# Patient Record
Sex: Male | Born: 2000
Health system: Southern US, Community
[De-identification: ages and names within clinical notes are randomized; demographics above are authoritative.]

## PROBLEM LIST (undated history)

## (undated) HISTORY — PX: TONSILLECTOMY AND ADENOIDECTOMY: SUR1326

---
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
## 2022-03-13 ENCOUNTER — Ambulatory Visit: Payer: 59 | Admitting: Family Medicine

## 2022-03-13 ENCOUNTER — Other Ambulatory Visit (HOSPITAL_BASED_OUTPATIENT_CLINIC_OR_DEPARTMENT_OTHER): Payer: Self-pay

## 2022-03-13 ENCOUNTER — Other Ambulatory Visit (HOSPITAL_COMMUNITY): Payer: Self-pay

## 2022-03-13 ENCOUNTER — Encounter: Payer: Self-pay | Admitting: Family Medicine

## 2022-03-13 VITALS — BP 112/78 | HR 79 | Temp 97.7°F | Resp 16 | Ht 69.5 in | Wt 136.2 lb

## 2022-03-13 DIAGNOSIS — F321 Major depressive disorder, single episode, moderate: Secondary | ICD-10-CM

## 2022-03-13 MED ORDER — FLUOXETINE HCL 10 MG PO TABS
10.0000 mg | ORAL_TABLET | Freq: Every day | ORAL | 3 refills | Status: DC
  Filled 2022-03-13 (×2): qty 30, 30d supply, fill #0

## 2022-03-13 NOTE — Patient Instructions (Signed)
Follow up in 1 month to recheck mood START the Fluoxetine once daily See if you can rediscover the things that bring you joy Call with any questions or concerns CONGRATS ON GRADUATION!!!!

## 2022-03-13 NOTE — Progress Notes (Signed)
   Subjective:    Patient ID: Shane Weaver, male    DOB: 2001/03/20, 21 y.o.   MRN: [id number]  HPI Depression- 'stuff hasn't been enjoyable'.  Mom feels sxs started in 2020.  Decreased motivation, decreased energy.  PHQ=17.  Plan is for [employer] in the fall.  Feeling 'blah'.  Pt 'dreads' work and school.  Has missed a lot of school, has been late multiple times.  Mom reports he is easily overwhelmed.  Pt has done counseling in the past and the counselor thought that he was very self aware and didn't necessarily need to continue.     Review of Systems For ROS see HPI     Objective:   Physical Exam Vitals reviewed.  Constitutional:      General: He is not in acute distress.    Appearance: Normal appearance. He is not ill-appearing.  HENT:     Head: Normocephalic and atraumatic.  Skin:    General: Skin is warm and dry.  Neurological:     General: No focal deficit present.     Mental Status: He is alert and oriented to person, place, and time.     Gait: Gait normal.  Psychiatric:        Behavior: Behavior normal.        Thought Content: Thought content normal.     Comments: Flat affect          Assessment & Plan:

## 2022-03-13 NOTE — Assessment & Plan Note (Signed)
New.  Pt has been struggling w/ mood, motivation, emotional fatigue since ~2020.  PHQ today is 17.  He is graduating next week but feels very ambivalent/apathetic about it.  Plan is for [employer] in the fall and mom hopes to improve his mood before sending him off.  Is 'dreading' work, school.  Has no enjoyment at this time- no longer playing guitar or hanging out with friends.  Denies SI/HI.  Has done counseling in the past.  At this time, is open to medication to improve mood and energy.  Will start w/ Prozac 10mg  daily and titrate prn.  Reviewed how to take medication, possible side effects, mechanism of action, and time to onset of sxs relief.  Discussed importance of starting counseling at school to assist him through his transition.  Will follow closely.

## 2022-04-16 ENCOUNTER — Ambulatory Visit (INDEPENDENT_AMBULATORY_CARE_PROVIDER_SITE_OTHER): Payer: 59 | Admitting: Family Medicine

## 2022-04-16 ENCOUNTER — Encounter: Payer: Self-pay | Admitting: Family Medicine

## 2022-04-16 ENCOUNTER — Other Ambulatory Visit (HOSPITAL_COMMUNITY): Payer: Self-pay

## 2022-04-16 ENCOUNTER — Telehealth: Payer: Self-pay

## 2022-04-16 VITALS — BP 120/78 | HR 90 | Temp 99.0°F | Resp 17 | Ht 69.0 in | Wt 134.2 lb

## 2022-04-16 DIAGNOSIS — F321 Major depressive disorder, single episode, moderate: Secondary | ICD-10-CM | POA: Diagnosis not present

## 2022-04-16 MED ORDER — FLUOXETINE HCL 20 MG PO CAPS
20.0000 mg | ORAL_CAPSULE | Freq: Every day | ORAL | 1 refills | Status: DC
  Filled 2022-04-16 – 2022-04-17 (×2): qty 90, 90d supply, fill #0
  Filled 2022-10-10: qty 90, 90d supply, fill #1

## 2022-04-16 NOTE — Telephone Encounter (Signed)
Notes forgot to ask at appt this morning, Previously discussed medications for acne, wanted to ask about the mediation you had previously recommended for this as he notes it is still an issue for him.

## 2022-04-16 NOTE — Assessment & Plan Note (Signed)
Pt reports sxs are improved since starting Fluoxetine 10mg  daily.  He feels there is still room for additional improvement and is interested in increasing the dose to 20mg  daily.  He is again finding enjoyment in things, has picked up his guitar again, has 2 upcoming gigs and is looking forward to them.  Will increase to 20mg  daily and continue to follow.

## 2022-04-16 NOTE — Progress Notes (Signed)
   Subjective:    Patient ID: Shane Weaver, male    DOB: May 29, 2001, 21 y.o.   MRN: [id number]  HPI Depression- last visit was started on Prozac 10mg .  Pt notes there has been improvement since starting medication.  Feels there is room for additional improvement and thinks we should increase dose.  Will intermittently have trouble sleeping.  Feels motivation has improved.  Is able to enjoy things again.  Back to playing guitar.  Pt feels parents have noticed a change.   Review of Systems For ROS see HPI     Objective:   Physical Exam Vitals reviewed.  Constitutional:      General: He is not in acute distress.    Appearance: Normal appearance. He is not ill-appearing.  HENT:     Head: Normocephalic and atraumatic.  Skin:    General: Skin is warm and dry.  Neurological:     General: No focal deficit present.     Mental Status: He is alert and oriented to person, place, and time.  Psychiatric:        Mood and Affect: Mood normal.        Behavior: Behavior normal.        Thought Content: Thought content normal.           Assessment & Plan:

## 2022-04-16 NOTE — Telephone Encounter (Signed)
Called lm to discuss this with pt no answer LM

## 2022-04-16 NOTE — Patient Instructions (Addendum)
Follow up as scheduled INCREASE the Prozac to 20mg  daily- new prescription sent to pharmacy Let's monitor sleep and see how things go Call with any questions or concerns Keep up the good work!!

## 2022-04-16 NOTE — Telephone Encounter (Signed)
I think it makes more sense to discuss this at his upcoming CPE in 3 weeks.  In the meantime, I would start w/ OTC Neutrogena Acne Wash twice daily

## 2022-04-17 ENCOUNTER — Other Ambulatory Visit (HOSPITAL_COMMUNITY): Payer: Self-pay

## 2022-04-17 ENCOUNTER — Other Ambulatory Visit (HOSPITAL_BASED_OUTPATIENT_CLINIC_OR_DEPARTMENT_OTHER): Payer: Self-pay

## 2022-05-05 ENCOUNTER — Encounter: Payer: Self-pay | Admitting: Family Medicine

## 2022-05-05 ENCOUNTER — Ambulatory Visit (INDEPENDENT_AMBULATORY_CARE_PROVIDER_SITE_OTHER): Payer: 59 | Admitting: Family Medicine

## 2022-05-05 ENCOUNTER — Other Ambulatory Visit (HOSPITAL_BASED_OUTPATIENT_CLINIC_OR_DEPARTMENT_OTHER): Payer: Self-pay

## 2022-05-05 ENCOUNTER — Other Ambulatory Visit (HOSPITAL_COMMUNITY): Payer: Self-pay

## 2022-05-05 VITALS — BP 118/67 | HR 64 | Temp 97.8°F | Resp 19 | Ht 70.0 in | Wt 131.4 lb

## 2022-05-05 DIAGNOSIS — Z23 Encounter for immunization: Secondary | ICD-10-CM | POA: Diagnosis not present

## 2022-05-05 DIAGNOSIS — Z0000 Encounter for general adult medical examination without abnormal findings: Secondary | ICD-10-CM | POA: Insufficient documentation

## 2022-05-05 MED ORDER — CLINDAMYCIN PHOS-BENZOYL PEROX 1-5 % EX GEL
Freq: Two times a day (BID) | CUTANEOUS | 3 refills | Status: DC
  Filled 2022-05-05: qty 25, 10d supply, fill #0
  Filled 2022-05-05: qty 25, 15d supply, fill #0

## 2022-05-05 NOTE — Patient Instructions (Addendum)
Schedule a nurse visit when you are home for the holidays for the 2nd HPV vaccines Follow up with me in 1 year or as needed START the Benzaclin (Clindamycin/Benzoyl Peroxide) in a thin layer twice daily to help fight acne I'm so glad that things are going better!  You deserve it! Call with any questions or concerns Stay Safe!  Stay Healthy! GOOD LUCK AT SCHOOL!!!

## 2022-05-05 NOTE — Assessment & Plan Note (Signed)
Pt's PE WNL.  Due for 2nd Meningitis B vaccine.  Wants to start HPV series.  No need for labs due to lack of risk factors.  Anticipatory guidance provided.

## 2022-05-05 NOTE — Progress Notes (Signed)
   Subjective:    Patient ID: Shane Weaver, male    DOB: 2001-03-14, 21 y.o.   MRN: [id number]  HPI CPE- due for 2nd Meningitis B, HPV.  Not yet sexually active.  Just graduated from HS.  Going to [facility] in August.  Not smoking or vaping.  No drug use.  No alcohol.  Not dating.  No thoughts of self harm.  Wearing seatbelt.     Review of Systems Patient reports no vision/hearing changes, anorexia, fever ,adenopathy, persistant/recurrent hoarseness, swallowing issues, chest pain, palpitations, edema, persistant/recurrent cough, hemoptysis, dyspnea (rest,exertional, paroxysmal nocturnal), gastrointestinal  bleeding (melena, rectal bleeding), abdominal pain, GU symptoms (dysuria, hematuria, voiding/incontinence issues) syncope, focal weakness, memory loss, numbness & tingling, skin/hair/nail changes, depression, anxiety, abnormal bruising/bleeding, musculoskeletal symptoms/signs.   + intermittent heartburn    Objective:   Physical Exam General Appearance:    Alert, cooperative, no distress, appears stated age  Head:    Normocephalic, without obvious abnormality, atraumatic  Eyes:    PERRL, conjunctiva/corneas clear, EOM's intact both eyes       Ears:    Normal TM's and external ear canals, both ears  Nose:   Nares normal, septum midline, mucosa normal, no drainage   or sinus tenderness  Throat:   Lips, mucosa, and tongue normal; teeth and gums normal  Neck:   Supple, symmetrical, trachea midline, no adenopathy;       thyroid:  No enlargement/tenderness/nodules  Back:     Symmetric, no curvature, ROM normal, no CVA tenderness  Lungs:     Clear to auscultation bilaterally, respirations unlabored  Chest wall:    No tenderness  Heart:    Regular rate and rhythm, S1 and S2 normal, no murmur, rub   or gallop  Abdomen:     Soft, non-tender, bowel sounds active all four quadrants,    no masses, no organomegaly  Genitalia:    deferred  Rectal:    Extremities:   Extremities normal, atraumatic, no  cyanosis or edema  Pulses:   2+ and symmetric all extremities  Skin:   Skin color, texture, turgor normal, no rashes or lesions  Lymph nodes:   Cervical, supraclavicular, and axillary nodes normal  Neurologic:   CNII-XII intact. Normal strength, sensation and reflexes      throughout          Assessment & Plan:

## 2022-05-16 ENCOUNTER — Other Ambulatory Visit (HOSPITAL_BASED_OUTPATIENT_CLINIC_OR_DEPARTMENT_OTHER): Payer: Self-pay

## 2022-05-16 DIAGNOSIS — L700 Acne vulgaris: Secondary | ICD-10-CM | POA: Diagnosis not present

## 2022-05-16 MED ORDER — MINOCYCLINE HCL 100 MG PO CAPS
100.0000 mg | ORAL_CAPSULE | Freq: Two times a day (BID) | ORAL | 3 refills | Status: DC
  Filled 2022-05-16: qty 60, 30d supply, fill #0
  Filled 2022-06-30: qty 60, 30d supply, fill #1

## 2022-05-16 MED ORDER — CLINDAMYCIN PHOSPHATE 1 % EX LOTN
TOPICAL_LOTION | CUTANEOUS | 3 refills | Status: DC
  Filled 2022-05-16: qty 60, 14d supply, fill #0

## 2022-06-06 ENCOUNTER — Telehealth: Payer: Self-pay

## 2022-06-06 ENCOUNTER — Other Ambulatory Visit: Payer: Self-pay

## 2022-06-06 NOTE — Telephone Encounter (Signed)
Patient states he would like a call or a message to discuss his Prozac he does not feel ije it is helping at all. Patient has moved to [place] so he states if he needs an appointment he can virtually.

## 2022-06-06 NOTE — Telephone Encounter (Signed)
Please schedule virtual appt so we can discuss and make changes

## 2022-06-10 ENCOUNTER — Telehealth: Payer: Self-pay | Admitting: Family Medicine

## 2022-06-10 NOTE — Telephone Encounter (Signed)
Father calling for pt;

## 2022-06-13 ENCOUNTER — Encounter: Payer: Self-pay | Admitting: Family Medicine

## 2022-06-13 ENCOUNTER — Telehealth (INDEPENDENT_AMBULATORY_CARE_PROVIDER_SITE_OTHER): Payer: 59 | Admitting: Family Medicine

## 2022-06-13 ENCOUNTER — Other Ambulatory Visit (HOSPITAL_COMMUNITY): Payer: Self-pay

## 2022-06-13 DIAGNOSIS — K219 Gastro-esophageal reflux disease without esophagitis: Secondary | ICD-10-CM

## 2022-06-13 DIAGNOSIS — F321 Major depressive disorder, single episode, moderate: Secondary | ICD-10-CM | POA: Diagnosis not present

## 2022-06-13 MED ORDER — FAMOTIDINE 40 MG PO TABS
40.0000 mg | ORAL_TABLET | Freq: Every day | ORAL | 3 refills | Status: DC
  Filled 2022-06-13: qty 30, 30d supply, fill #0

## 2022-06-13 MED ORDER — BUPROPION HCL 75 MG PO TABS
75.0000 mg | ORAL_TABLET | Freq: Two times a day (BID) | ORAL | 3 refills | Status: DC
  Filled 2022-06-13: qty 60, 30d supply, fill #0

## 2022-06-13 NOTE — Progress Notes (Signed)
   Virtual Visit via Video   I connected with patient on 06/13/22 at  9:00 AM EDT by a video enabled telemedicine application and verified that I am speaking with the correct person using two identifiers.  Location patient: Home Location provider: [place], Office Persons participating in the virtual visit: Patient, Provider, CMA Sheryle Hail C)  I discussed the limitations of evaluation and management by telemedicine and the availability of in person appointments. The patient expressed understanding and agreed to proceed.  Subjective:   HPI:   Depression- pt recently moved in to college.  Is doing well w/ the transition.  Doesn't feel like it's much different.  'my mood has been all over the place- even before I moved'.  Pt reports he will have rapid mood changes.  When pt first started the medication he felt that things were improving, 'i don't really know what changed'.  Currently on Prozac 20mg  daily.  GERD- pt reports increased sxs since starting his Minocycline.  Sxs are worse at night.  Having to take antacids pretty regularly.    ROS:   See pertinent positives and negatives per HPI.  Patient Active Problem List   Diagnosis Date Noted   Physical exam 05/05/2022   Depression, major, single episode, moderate (HCC) 03/13/2022   Pectus excavatum 06/29/2019   Colorblind 06/24/2018    Social History   Tobacco Use   Smoking status: Never   Smokeless tobacco: Never  Substance Use Topics   Alcohol use: No    Current Outpatient Medications:    FLUoxetine (PROZAC) 20 MG capsule, Take 1 capsule (20 mg total) by mouth daily., Disp: 90 capsule, Rfl: 1   Melatonin 1 MG CAPS, Take by mouth as needed., Disp: , Rfl:    minocycline (MINOCIN) 100 MG capsule, Take 1 capsule (100 mg total) by mouth 2 (two) times daily with food and water., Disp: 60 capsule, Rfl: 3   clindamycin (CLEOCIN T) 1 % lotion, Apply topically to affected areas up to twice daily as needed. (Patient not  taking: Reported on 06/13/2022), Disp: 60 mL, Rfl: 3   clindamycin-benzoyl peroxide (BENZACLIN) gel, Apply topically 2 (two) times daily. (Patient not taking: Reported on 06/13/2022), Disp: 50 g, Rfl: 3  No Known Allergies  Objective:   There were no vitals taken for this visit. AAOx3, NAD NCAT, EOMI No obvious CN deficits Coloring WNL Pt is able to speak clearly, coherently without shortness of breath or increased work of breathing.  Thought process is linear.  Mood is appropriate.   Assessment and Plan:   Depression- deteriorated.  Pt reports sxs were much improved but seem to have worsened here recently and he's not sure why.  Discussed possibility of increasing Fluoxetine to 40mg  daily vs adding Wellbutrin and he is interested in adding the Wellbutring 75mg  BID.  He reports rapid mood swings which he compares to bipolar, but 'manic sxs' only last 30 minutes to 2 hrs and are more like agitation rather than true mania.  Will continue to follow closely and adjust meds prn.  Pt expressed understanding and is in agreement w/ plan.   GERD- new.  Pt reports sxs started after starting his Minocycline.  Will start prescription strength Famotidine (40mg ) daily to hopefully get ahead of his sxs rather than trying to play catch up w/ antacids.  Pt expressed understanding and is in agreement w/ plan.    08/13/2022, MD 06/13/2022

## 2022-06-13 NOTE — Telephone Encounter (Signed)
Patient was scheduled

## 2022-06-17 NOTE — Telephone Encounter (Signed)
error 

## 2022-06-18 ENCOUNTER — Other Ambulatory Visit (HOSPITAL_COMMUNITY): Payer: Self-pay

## 2022-06-30 ENCOUNTER — Other Ambulatory Visit (HOSPITAL_COMMUNITY): Payer: Self-pay

## 2022-10-10 ENCOUNTER — Other Ambulatory Visit (HOSPITAL_BASED_OUTPATIENT_CLINIC_OR_DEPARTMENT_OTHER): Payer: Self-pay

## 2022-11-26 DIAGNOSIS — L739 Follicular disorder, unspecified: Secondary | ICD-10-CM | POA: Diagnosis not present

## 2022-11-26 DIAGNOSIS — R21 Rash and other nonspecific skin eruption: Secondary | ICD-10-CM | POA: Diagnosis not present

## 2023-11-25 DIAGNOSIS — R051 Acute cough: Secondary | ICD-10-CM | POA: Diagnosis not present

## 2023-11-25 DIAGNOSIS — J069 Acute upper respiratory infection, unspecified: Secondary | ICD-10-CM | POA: Diagnosis not present

## 2024-04-06 ENCOUNTER — Encounter: Payer: Self-pay | Admitting: Family Medicine

## 2024-04-06 ENCOUNTER — Telehealth: Payer: Self-pay

## 2024-04-06 ENCOUNTER — Other Ambulatory Visit (HOSPITAL_COMMUNITY): Payer: Self-pay

## 2024-04-06 ENCOUNTER — Other Ambulatory Visit: Payer: Self-pay

## 2024-04-06 ENCOUNTER — Ambulatory Visit: Payer: Self-pay | Admitting: Family Medicine

## 2024-04-06 VITALS — BP 118/68 | HR 81 | Temp 98.5°F | Ht 71.0 in | Wt 138.4 lb

## 2024-04-06 DIAGNOSIS — F32A Depression, unspecified: Secondary | ICD-10-CM | POA: Diagnosis not present

## 2024-04-06 DIAGNOSIS — F419 Anxiety disorder, unspecified: Secondary | ICD-10-CM | POA: Diagnosis not present

## 2024-04-06 DIAGNOSIS — Z0000 Encounter for general adult medical examination without abnormal findings: Secondary | ICD-10-CM

## 2024-04-06 MED ORDER — SERTRALINE HCL 50 MG PO TABS
50.0000 mg | ORAL_TABLET | Freq: Every day | ORAL | 3 refills | Status: DC
  Filled 2024-04-06: qty 30, 30d supply, fill #0

## 2024-04-06 MED ORDER — SERTRALINE HCL 50 MG PO TABS: 50.0000 mg | ORAL_TABLET | Freq: Every day | ORAL | 3 refills | Status: DC

## 2024-04-06 NOTE — Assessment & Plan Note (Signed)
 Pt's PE WNL.  Declines HPV vaccine.  UTD on Tdap.  No need for labs based on lack of risk factors.  Anticipatory guidance provided.

## 2024-04-06 NOTE — Assessment & Plan Note (Signed)
 Deteriorated.  Pt reports that Prozac  and Wellbutrin  were not helpful so he took himself off the medication.  He feels that sxs have since worsened and he is having racing thoughts, poor sleep, and trouble focusing.  Discussed that this can all be anxiety related or these could be sxs of ADHD.  Will start Sertraline 50mg  daily and monitor closely for improvement.  If no improvement will need to consider ADHD evaluation.  Pt expressed understanding and is in agreement w/ plan.

## 2024-04-06 NOTE — Progress Notes (Signed)
   Subjective:    Patient ID: Shane Weaver, male    DOB: 11-13-2000, 23 y.o.   MRN: [id number]  HPI CPE- UTD on Tdap.  Due for HPV- declines.  Taking Japanese intensive summer course.  Heads back to [facility] in late August.    Not smoking or vaping.  Occasional alcohol.  No drug use.  Dating, feels safe in relationship.  Using condoms.    Patient Care Team    Relationship Specialty Notifications Start End  Mahlon Comer BRAVO, MD PCP - General Family Medicine  07/29/16      Health Maintenance  Topic Date Due   COVID-19 Vaccine (1 - 2024-25 season) 04/22/2024 (Originally 06/14/2023)   HPV VACCINES (2 - Male 3-dose series) 04/06/2025 (Originally 06/02/2022)   INFLUENZA VACCINE  05/13/2024   DTaP/Tdap/Td (7 - Td or Tdap) 07/19/2024   Pneumococcal Vaccine 66-56 Years old  Completed   Hepatitis B Vaccines  Completed   Meningococcal B Vaccine  Completed   Hepatitis C Screening  Discontinued   HIV Screening  Discontinued      Review of Systems Patient reports no vision/hearing changes, anorexia, fever ,adenopathy, persistant/recurrent hoarseness, swallowing issues, chest pain, palpitations, edema, persistant/recurrent cough, hemoptysis, dyspnea (rest,exertional, paroxysmal nocturnal), gastrointestinal  bleeding (melena, rectal bleeding), abdominal pain, excessive heart burn, GU symptoms (dysuria, hematuria, voiding/incontinence issues) syncope, focal weakness, memory loss, numbness & tingling, skin/hair/nail changes,  abnormal bruising/bleeding, musculoskeletal symptoms/signs.   + anxiety/depression- pt reports anxiety is worsening.  Did not feel that Fluoxetine  and Wellbutrin  were helping so he stopped these meds.  Notes racing thoughts.  + insomnia    Objective:   Physical Exam General Appearance:    Alert, cooperative, no distress, appears stated age  Head:    Normocephalic, without obvious abnormality, atraumatic  Eyes:    PERRL, conjunctiva/corneas clear, EOM's intact both eyes        Ears:    Normal TM's and external ear canals, both ears  Nose:   Nares normal, septum midline, mucosa normal, no drainage   or sinus tenderness  Throat:   Lips, mucosa, and tongue normal; teeth and gums normal  Neck:   Supple, symmetrical, trachea midline, no adenopathy;       thyroid:  No enlargement/tenderness/nodules  Back:     Symmetric, no curvature, ROM normal, no CVA tenderness  Lungs:     Clear to auscultation bilaterally, respirations unlabored  Chest wall:    No tenderness or deformity  Heart:    Regular rate and rhythm, S1 and S2 normal, no murmur, rub   or gallop  Abdomen:     Soft, non-tender, bowel sounds active all four quadrants,    no masses, no organomegaly  Genitalia:    deferred  Rectal:    Extremities:   Extremities normal, atraumatic, no cyanosis or edema  Pulses:   2+ and symmetric all extremities  Skin:   Skin color, texture, turgor normal, no rashes or lesions  Lymph nodes:   Cervical, supraclavicular, and axillary nodes normal  Neurologic:   CNII-XII intact. Normal strength, sensation and reflexes      throughout          Assessment & Plan:

## 2024-04-06 NOTE — Patient Instructions (Signed)
 Follow up in 4-6 weeks to recheck mood START the Sertraline once daily Keep up the good work- you look great! Call with any questions or concerns Stay Safe!  Stay Healthy! Have a great summer!!!

## 2024-04-06 NOTE — Telephone Encounter (Signed)
 Spoke with patient's father and let him know that I had corrected the pharmacy and sent the medication there.

## 2024-04-07 ENCOUNTER — Other Ambulatory Visit (HOSPITAL_COMMUNITY): Payer: Self-pay

## 2024-04-08 ENCOUNTER — Other Ambulatory Visit (HOSPITAL_COMMUNITY): Payer: Self-pay

## 2024-04-29 ENCOUNTER — Encounter: Payer: Self-pay | Admitting: Student in an Organized Health Care Education/Training Program

## 2024-04-29 ENCOUNTER — Ambulatory Visit (INDEPENDENT_AMBULATORY_CARE_PROVIDER_SITE_OTHER): Admitting: Student in an Organized Health Care Education/Training Program

## 2024-04-29 ENCOUNTER — Ambulatory Visit: Admitting: Family Medicine

## 2024-04-29 VITALS — BP 124/64 | HR 80 | Ht 71.0 in | Wt 140.2 lb

## 2024-04-29 DIAGNOSIS — F32A Depression, unspecified: Secondary | ICD-10-CM

## 2024-04-29 DIAGNOSIS — R41840 Attention and concentration deficit: Secondary | ICD-10-CM | POA: Insufficient documentation

## 2024-04-29 DIAGNOSIS — F419 Anxiety disorder, unspecified: Secondary | ICD-10-CM | POA: Diagnosis not present

## 2024-04-29 NOTE — Patient Instructions (Signed)
 If our referral person cannot get you scheduled testing for ADHD soon, please reach out to the below office:  [facility] Address: [address] [place], [place], [place] [address] Phone: [phone]   VISIT SUMMARY: During your visit, we discussed your worsening depression and anxiety symptoms after starting Zoloft , as well as your struggles with motivation, sleep, and potential ADHD. We reviewed your medication history and current symptoms to develop a plan moving forward.  YOUR PLAN: -DEPRESSIVE DISORDER: Depressive disorder is a condition characterized by persistent feelings of sadness and loss of interest. You experienced worsening symptoms after starting Zoloft , including a depressed mood and thoughts of self-harm. We decided to discontinue Zoloft  and hold off on starting any new medications for anxiety or depression until further evaluation.  -ATTENTION-DEFICIT/HYPERACTIVITY DISORDER (ADHD) - SUSPECTED: ADHD is a condition that includes symptoms such as difficulty paying attention, hyperactivity, and impulsiveness. You have reported symptoms that suggest ADHD, such as difficulty starting and completing tasks. We will refer you for an ADHD evaluation and testing to confirm the diagnosis.  -INSOMNIA: Insomnia is a sleep disorder where you have trouble falling and/or staying asleep. You reported difficulty initiating sleep and frequent awakenings. We recommend continuing to use melatonin as needed to help with sleep initiation.  INSTRUCTIONS: Please discontinue taking Zoloft  immediately. We will not start any new medications for anxiety or depression until we have further evaluated your symptoms. You will be referred for an ADHD evaluation and testing. Continue using melatonin as needed to help with sleep initiation. Follow up with us  after your ADHD evaluation or sooner if your symptoms worsen.

## 2024-04-29 NOTE — Assessment & Plan Note (Signed)
 Experiencing worsening depressive symptoms after starting sertraline  (Zoloft ) approximately four weeks ago, including depressed mood, lack of motivation, difficulty with coursework, and new-onset thoughts of self-harm. Reported improvement after missing a dose, suggesting an adverse reaction. Previous trials with Prozac  and Wellbutrin  were unsatisfactory, indicating potential sensitivity to these medications. Further antidepressant trials are not recommended due to adverse effects and lack of efficacy with multiple SSRIs and Wellbutrin . - Discontinue sertraline  (Zoloft ) - Hold off on starting any new anxiety or depression medications until further evaluation

## 2024-04-29 NOTE — Progress Notes (Signed)
 Established Patient Office Visit  Subjective   Patient ID: Shane Weaver, male    DOB: Sep 17, 2001  Age: 23 y.o. MRN: [id number]  Chief Complaint  Patient presents with   Anxiety    Following up on zoloft . Thinks medication has made things worse. Feeling weird and gotten depressed since taking it. Missed 1 day and woke up feeling better. Stopped zoloft  2 days ago    HPI  Discussed the use of AI scribe software for clinical note transcription with the patient, who gave verbal consent to proceed.  History of Present Illness Shane Weaver is a 23 year old male who presents with worsening depression and anxiety after starting Zoloft .  He started Zoloft  four weeks ago and initially experienced dissociative feelings during the first week, which subsided. However, his mental health declined with a rapid increase in depressive symptoms, primarily a depressed mood impacting his ability to complete coursework for an online Japanese intensive course.  Two days ago, he forgot to take Zoloft  and noticed an improvement in his mood the following day, as noted by his girlfriend. He has previously tried Prozac  and Wellbutrin  without satisfactory results. He currently experiences a lack of motivation, difficulty getting out of bed, and increased frustration. He also has insomnia, with difficulty initiating sleep and frequent awakenings, despite occasional use of melatonin.  He has been informed by others, including his girlfriend, that he might have ADHD, and he reports experiencing symptoms such as difficulty starting and finishing tasks. He is currently off for the summer but is enrolled in a college program starting in early August.  Thoughts of self-harm emerged after starting Zoloft , although he has no intention of acting on them. He is not taking any other medications except for biotin for hair and melatonin occasionally.     Objective:     BP 124/64 (BP Location: Right Arm, Cuff Size: Normal)    Pulse 80   Ht 5' 11 (1.803 m)   Wt 140 lb 3.2 oz (63.6 kg)   SpO2 100%   BMI 19.55 kg/m    Physical Exam  Gen: Well-appearing Neck: Normal thyroid no nodules or adenopathy Heart: Regular, no murmur Lungs: Unlabored, clear throughout Psych: Appropriate mood and affect, not anxious or depressed appearing on exam, he is distractible at times but otherwise well focused, normal speech, organized thoughts    Assessment & Plan:    Problem List Items Addressed This Visit       Unprioritized   Anxiety and depression   Experiencing worsening depressive symptoms after starting sertraline  (Zoloft ) approximately four weeks ago, including depressed mood, lack of motivation, difficulty with coursework, and new-onset thoughts of self-harm. Reported improvement after missing a dose, suggesting an adverse reaction. Previous trials with Prozac  and Wellbutrin  were unsatisfactory, indicating potential sensitivity to these medications. Further antidepressant trials are not recommended due to adverse effects and lack of efficacy with multiple SSRIs and Wellbutrin . - Discontinue sertraline  (Zoloft ) - Hold off on starting any new anxiety or depression medications until further evaluation      Relevant Orders   Ambulatory referral to Psychology   Attention and concentration deficit - Primary   Exhibits symptoms suggestive of ADHD, including difficulty starting and completing tasks, lack of motivation, and academic struggles. Symptoms align with ADHD, and he and others have noted these tendencies. Open to further evaluation to confirm the diagnosis. Addressing ADHD may provide a more targeted approach to managing his symptoms. - Refer for ADHD evaluation and testing  Relevant Orders   Ambulatory referral to Psychology    Return in about 5 weeks (around 06/03/2024) for Follow up with Dr. Mahlon.    Cleatus Debby Specking, MD

## 2024-04-29 NOTE — Assessment & Plan Note (Signed)
 Exhibits symptoms suggestive of ADHD, including difficulty starting and completing tasks, lack of motivation, and academic struggles. Symptoms align with ADHD, and he and others have noted these tendencies. Open to further evaluation to confirm the diagnosis. Addressing ADHD may provide a more targeted approach to managing his symptoms. - Refer for ADHD evaluation and testing

## 2024-05-04 ENCOUNTER — Telehealth: Payer: Self-pay

## 2024-05-04 NOTE — Telephone Encounter (Signed)
 Patient saw Dr. Jerrell for ADHD follow up on 04/29/24. He d/c Zoloft  and was told to hold off on medication until further evaluation. A referral was placed that day for [facility]. He has a follow up appointment with Dr. Mahlon in August. Please advise, thank you   Copied from CRM [id number]. Topic: Clinical - Medication Question >> May 03, 2024  4:40 PM Rea C wrote: Reason for CRM: Reason for CRM: Patient was speaking with Dr. Mahlon about new medications that he may want to try because he didn't have success with Zoloft . And, they also inquired about ADHD testing. One medication patient learned about was Guanfacine and it's had success for both people that referred him. Patient is wondering if that may be an option for him to take.   [phone] (H)

## 2024-05-04 NOTE — Telephone Encounter (Signed)
 Called patient to relay information below. Unable to reach. Left vm to return call. Has referral team contacted patient?

## 2024-05-04 NOTE — Telephone Encounter (Signed)
 Given his history of side effects to medications, would not recommend guanfacine right now.  Would recommend formal testing for attention differences prior to initiating ADHD medications.

## 2024-05-04 NOTE — Telephone Encounter (Signed)
 Looking at referral it looks like a vm was left for patient and they have added him to the testing wait list. They will call when appointment becomes available. See image below

## 2024-05-05 NOTE — Telephone Encounter (Unsigned)
 Copied from CRM #[id number]. Topic: General - Call Back - No Documentation >> May 04, 2024  3:06 PM Shereese L wrote: Reason for CRM: relayed message as per referral stated  and they have added him to the testing wait list. >> May 05, 2024  4:41 PM [place] J wrote: Patient would like to let Dr. Mahlon know that his school offers ADHD testing at a more affordable price and he will most likely be going with this option. Being said, he would like to be removed for the wait list we added him to for ADHD testing.

## 2024-05-05 NOTE — Telephone Encounter (Signed)
 Called patient to relay both messages for referral and medication, Left VM to return call.

## 2024-05-06 NOTE — Telephone Encounter (Signed)
 Called patient to to let him know he will have to call [facility] to cancel his appointment on the waiting list. Did relay the number to the patient. Patient states his school offers testing for a cheaper price and would prefer to go this route.

## 2024-05-30 ENCOUNTER — Telehealth (INDEPENDENT_AMBULATORY_CARE_PROVIDER_SITE_OTHER): Admitting: Family Medicine

## 2024-05-30 ENCOUNTER — Encounter: Payer: Self-pay | Admitting: Family Medicine

## 2024-05-30 ENCOUNTER — Ambulatory Visit: Admitting: Family Medicine

## 2024-05-30 DIAGNOSIS — F419 Anxiety disorder, unspecified: Secondary | ICD-10-CM

## 2024-05-30 DIAGNOSIS — F32A Depression, unspecified: Secondary | ICD-10-CM

## 2024-05-30 MED ORDER — BUSPIRONE HCL 7.5 MG PO TABS: 7.5000 mg | ORAL_TABLET | Freq: Two times a day (BID) | ORAL | 3 refills | Status: AC

## 2024-05-30 NOTE — Progress Notes (Unsigned)
   Virtual Visit via Video   I connected with patient on 05/30/24 at  2:20 PM EDT by a video enabled telemedicine application and verified that I am speaking with the correct person using two identifiers.  Location patient: Home Location provider: [facility] Office Persons participating in the virtual visit: Patient, Provider, CMA (Tonya H)  I discussed the limitations of evaluation and management by telemedicine and the availability of in person appointments. The patient expressed understanding and agreed to proceed.  Subjective:   HPI:   Anxiety- pt reports ongoing high anxiety.  Yesterday was anxious for much of the day- despite it being his [age] and classes not starting yet.  Reports he can't pinpoint a specific trigger.  Feels anxiety will just sort of 'hit out of nowhere'.  Pt feels that he's anxious ~50% of the time.  ROS:   See pertinent positives and negatives per HPI.  Patient Active Problem List   Diagnosis Date Noted   Attention and concentration deficit 04/29/2024   Anxiety and depression 04/06/2024   Physical exam 05/05/2022   Pectus excavatum 06/29/2019   Colorblind 06/24/2018    Social History   Tobacco Use   Smoking status: Never   Smokeless tobacco: Never  Substance Use Topics   Alcohol use: No    Current Outpatient Medications:    BIOTIN PO, Take by mouth daily., Disp: , Rfl:    Melatonin 1 MG CAPS, Take by mouth as needed., Disp: , Rfl:    Multiple Vitamin (MULTIVITAMIN ADULT PO), Take by mouth daily., Disp: , Rfl:   No Known Allergies  Objective:   There were no vitals taken for this visit. AAOx3, NAD NCAT, EOMI No obvious CN deficits Coloring WNL Pt is able to speak clearly, coherently without shortness of breath or increased work of breathing.  Thought process is linear.  Mood is appropriate.   Assessment and Plan:   Anxiety- deteriorated.  SSRI caused worsening depression.  He is planning to get ADHD testing done through  school.  Currently he states he feels anxious ~50% of the time.  Want to avoid Benzos in a young, healthy person.  Will start low dose Buspar  and titrate prn.  Pt expressed understanding and is in agreement w/ plan.    Comer Greet, MD 05/30/2024
